# Patient Record
Sex: Male | Born: 1990 | Race: Black or African American | Hispanic: No | Marital: Single | Smoking: Never smoker
Health system: Southern US, Community
[De-identification: ages and names within clinical notes are randomized; demographics above are authoritative.]

## PROBLEM LIST (undated history)

## (undated) DIAGNOSIS — R011 Cardiac murmur, unspecified: Secondary | ICD-10-CM

---

## 2014-10-16 ENCOUNTER — Emergency Department (HOSPITAL_COMMUNITY): Payer: Managed Care, Other (non HMO)

## 2014-10-16 ENCOUNTER — Encounter (HOSPITAL_COMMUNITY): Payer: Self-pay | Admitting: Family Medicine

## 2014-10-16 ENCOUNTER — Emergency Department (HOSPITAL_COMMUNITY)
Admission: EM | Admit: 2014-10-16 | Discharge: 2014-10-16 | Disposition: A | Discharge status: Home / Self Care | Payer: Managed Care, Other (non HMO) | Attending: Emergency Medicine | Admitting: Emergency Medicine

## 2014-10-16 DIAGNOSIS — R011 Cardiac murmur, unspecified: Secondary | ICD-10-CM | POA: Insufficient documentation

## 2014-10-16 DIAGNOSIS — Z7982 Long term (current) use of aspirin: Secondary | ICD-10-CM | POA: Insufficient documentation

## 2014-10-16 DIAGNOSIS — R079 Chest pain, unspecified: Secondary | ICD-10-CM | POA: Insufficient documentation

## 2014-10-16 HISTORY — DX: Cardiac murmur, unspecified: R01.1

## 2014-10-16 LAB — CBC
HCT: 39.4 % (ref 39.0–52.0)
Hemoglobin: 13.6 g/dL (ref 13.0–17.0)
MCH: 30.7 pg (ref 26.0–34.0)
MCHC: 34.5 g/dL (ref 30.0–36.0)
MCV: 88.9 fL (ref 78.0–100.0)
Platelets: 197 10*3/uL (ref 150–400)
RBC: 4.43 MIL/uL (ref 4.22–5.81)
RDW: 12.7 % (ref 11.5–15.5)
WBC: 3.6 10*3/uL — ABNORMAL LOW (ref 4.0–10.5)

## 2014-10-16 LAB — BASIC METABOLIC PANEL
Anion gap: 9 (ref 5–15)
BUN: 13 mg/dL (ref 6–23)
CHLORIDE: 104 meq/L (ref 96–112)
CO2: 25 mmol/L (ref 19–32)
Calcium: 9.3 mg/dL (ref 8.4–10.5)
Creatinine, Ser: 1.12 mg/dL (ref 0.50–1.35)
GFR calc Af Amer: >90 mL/min (ref >90)
GFR calc non Af Amer: >90 mL/min (ref >90)
Glucose, Bld: 88 mg/dL (ref 70–99)
Potassium: 3.7 mmol/L (ref 3.5–5.1)
Sodium: 138 mmol/L (ref 135–145)

## 2014-10-16 LAB — I-STAT TROPONIN, ED: Troponin i, poc: 0 ng/mL (ref 0.00–0.08)

## 2014-10-16 MED ORDER — OMEPRAZOLE 20 MG PO CPDR: 20.0000 mg | DELAYED_RELEASE_CAPSULE | Freq: Every day | ORAL | Status: AC

## 2014-10-16 NOTE — ED Notes (Signed)
MD at bedside. 

## 2014-10-16 NOTE — ED Notes (Signed)
[facility]. sts left sided x 3 days. Denies SOB. Denies coughing fever.

## 2014-10-16 NOTE — ED Provider Notes (Signed)
CSN: [id number]     Arrival date & time 10/16/14  1359 History   First MD Initiated Contact with Patient 10/16/14 1505     Chief Complaint  Patient presents with  . Chest Pain      HPI Patient reports intermittent sharp left-sided chest pain without associated symptoms over the past week.  Denies shortness of breath.  He reports his been playing basketball this week without any difficulty.  He never develops exertional chest pain.  No lightheadedness, palpitations, syncope.  Denies fevers and chills.  No productive cough.  Symptoms are not always associated with food.  He is visiting his family here in [place].  He lives in [place].  He works at a high school.   no recent sick contacts.  No recent infectious symptoms such as fever, nasal congestion, sore throat.   Past Medical History  Diagnosis Date  . Heart murmur    History reviewed. No pertinent past surgical history. History reviewed. No pertinent family history. History  Substance Use Topics  . Smoking status: Never Smoker   . Smokeless tobacco: Not on file  . Alcohol Use: No    Review of Systems  All other systems reviewed and are negative.     Allergies  Review of patient's allergies indicates no known allergies.  Home Medications   Prior to Admission medications   Medication Sig Start Date End Date Taking? Authorizing Provider  aspirin 325 MG tablet Take 325 mg by mouth daily.   Yes Historical Provider, MD  omeprazole (PRILOSEC) 20 MG capsule Take 1 capsule (20 mg total) by mouth daily. 10/16/14   Lyanne CoKevin M Deirdra Heumann, MD   BP 134/84 mmHg  Pulse 78  Temp(Src) 98.1 F (36.7 C)  Resp 21  SpO2 100% Physical Exam  Constitutional: He is oriented to person, place, and time. He appears well-developed and well-nourished.  HENT:  Head: Normocephalic and atraumatic.  Eyes: EOM are normal.  Neck: Normal range of motion.  Cardiovascular: Normal rate, regular rhythm, normal heart sounds and intact  distal pulses.   Pulmonary/Chest: Effort normal and breath sounds normal. No respiratory distress. He exhibits no tenderness.  Abdominal: Soft. He exhibits no distension. There is no tenderness.  Musculoskeletal: Normal range of motion.  Neurological: He is alert and oriented to person, place, and time.  Skin: Skin is warm and dry.  Psychiatric: He has a normal mood and affect. Judgment normal.  Nursing note and vitals reviewed.   ED Course  Procedures (including critical care time) Labs Review Labs Reviewed  CBC - Abnormal; Notable for the following:    WBC 3.6 (*)    All other components within normal limits  BASIC METABOLIC PANEL  I-STAT TROPOININ, ED    Imaging Review Dg Chest 2 View  10/16/2014   CLINICAL DATA:  Chest pain for 3 days.  EXAM: CHEST  2 VIEW  COMPARISON:  None.  FINDINGS: The heart size and mediastinal contours are within normal limits. Both lungs are clear. No pneumothorax or pleural effusion is noted. The visualized skeletal structures are unremarkable.  IMPRESSION: No acute cardiopulmonary abnormality seen.   Electronically Signed   By: Roque LiasJames  Green M.D.   On: 10/16/2014 14:57     EKG Interpretation   Date/Time:  Thursday October 16 2014 14:07:30 EST Ventricular Rate:  66 PR Interval:  158 QRS Duration: 90 QT Interval:  364 QTC Calculation: 381 R Axis:   102 Text Interpretation:  Normal sinus rhythm with sinus arrhythmia Rightward  axis Nonspecific ST abnormality Abnormal ECG No old tracing to compare  Confirmed by Justine Cossin  MD, Caryn BeeKEVIN ([id number]) on 10/16/2014 3:22:51 PM      MDM   Final diagnoses:  Chest pain, unspecified chest pain type    Overall the patient is well-appearing.  He has intermittent sharps chest pain.  He has no associated symptoms.  He has no cardiac risk factors.  No syncope.  He does have a slightly abnormal EKG however I think his findings are more consistent with early repolarization abnormalities is a healthy thin chest wall  demand.  He plays basketball without any issues at all.  This does not appear to be Brugada either.  I've asked that the patient return to the ER for any new or worsening symptoms.  He has absolutely no symptoms at this time.  No additional workup necessary in the hospital.  He will find a primary care physician when [place] to [place].    Lyanne CoKevin M Katina Remick, MD 10/16/14 [phone]

## 2014-10-16 NOTE — Discharge Instructions (Signed)

## 2016-06-08 IMAGING — DX DG CHEST 2V
2 series · 2 of 2 positions shown · non-contrast
Comparison: None.

CLINICAL DATA: Chest pain for 3 days.

EXAM:
CHEST  2 VIEW

[chest pa]
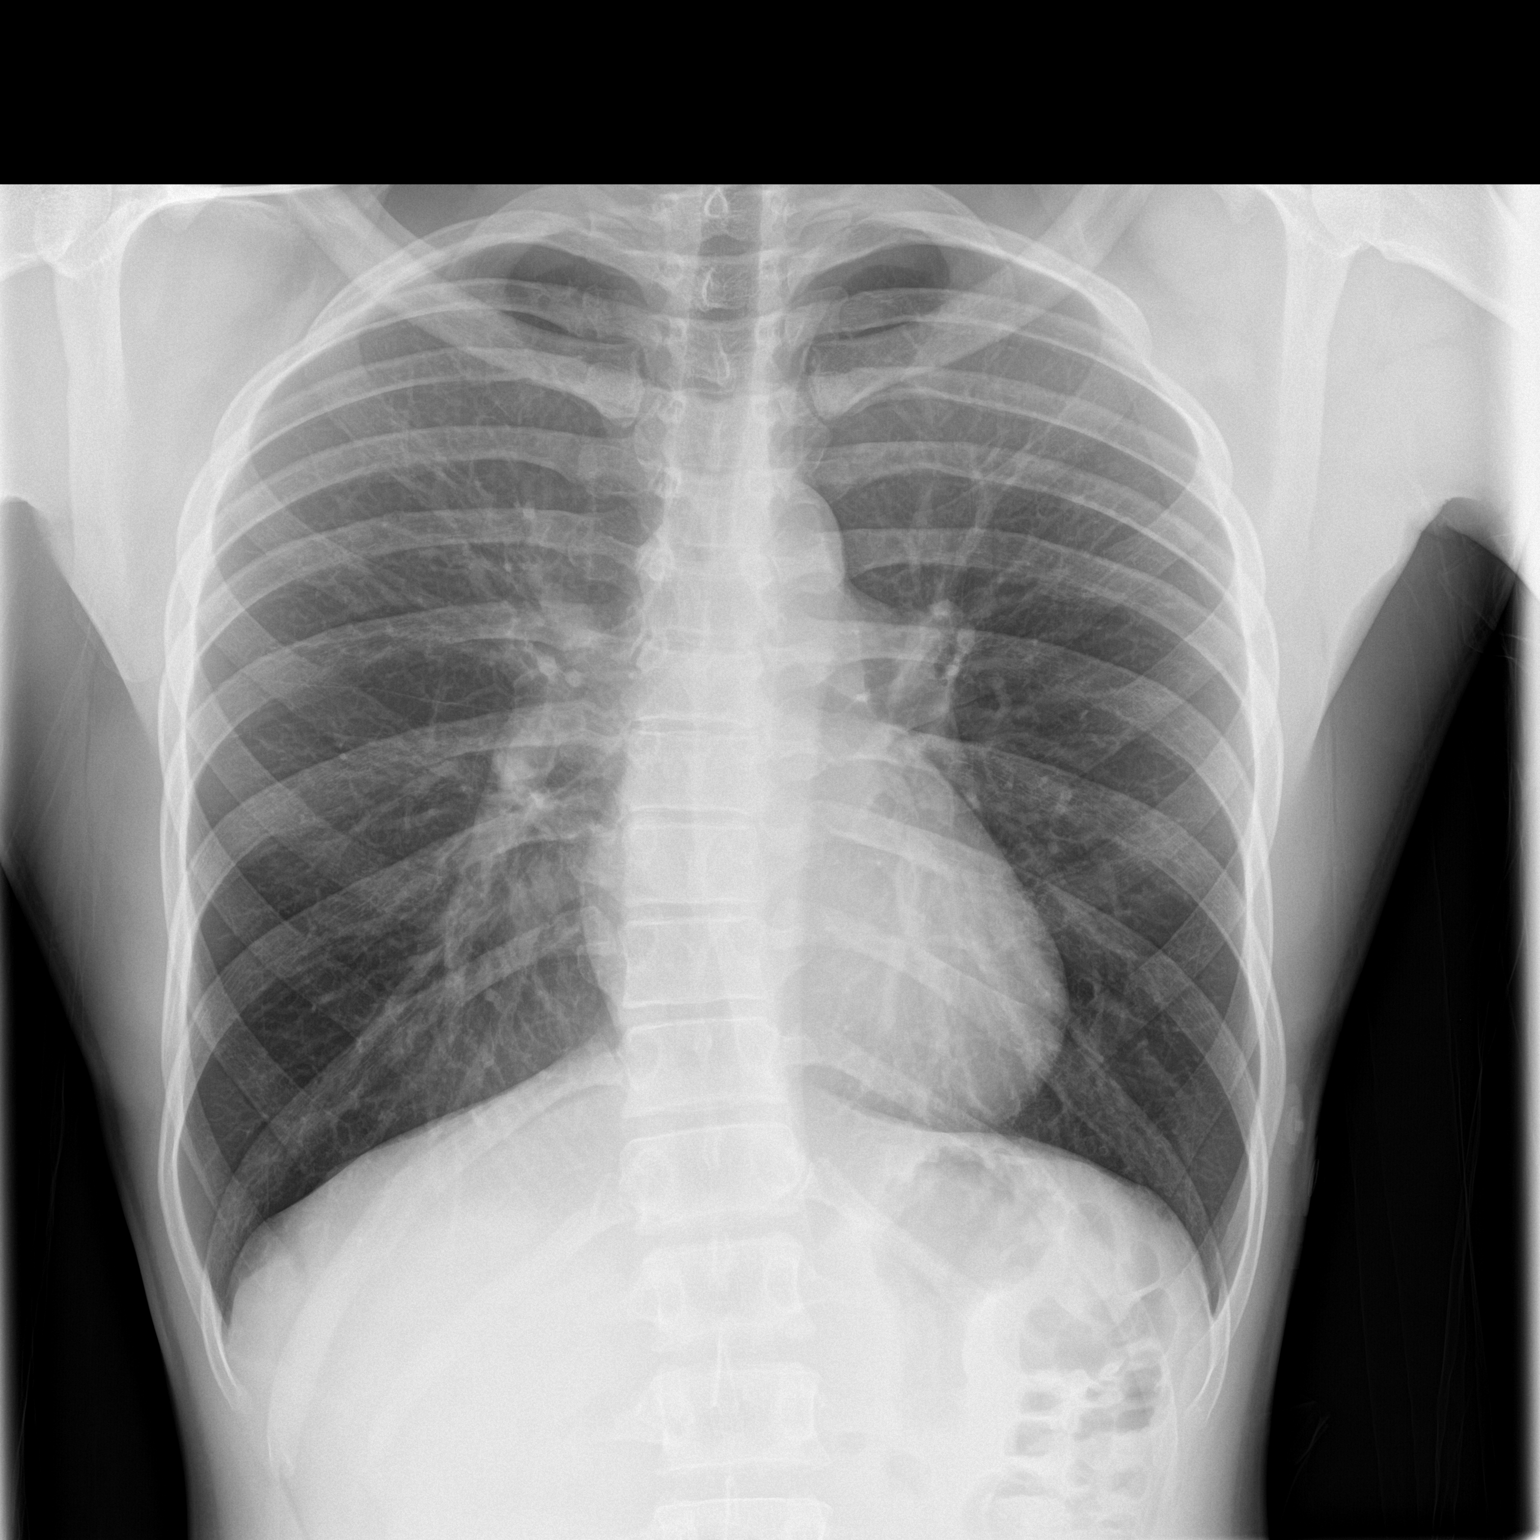

[chest lat]
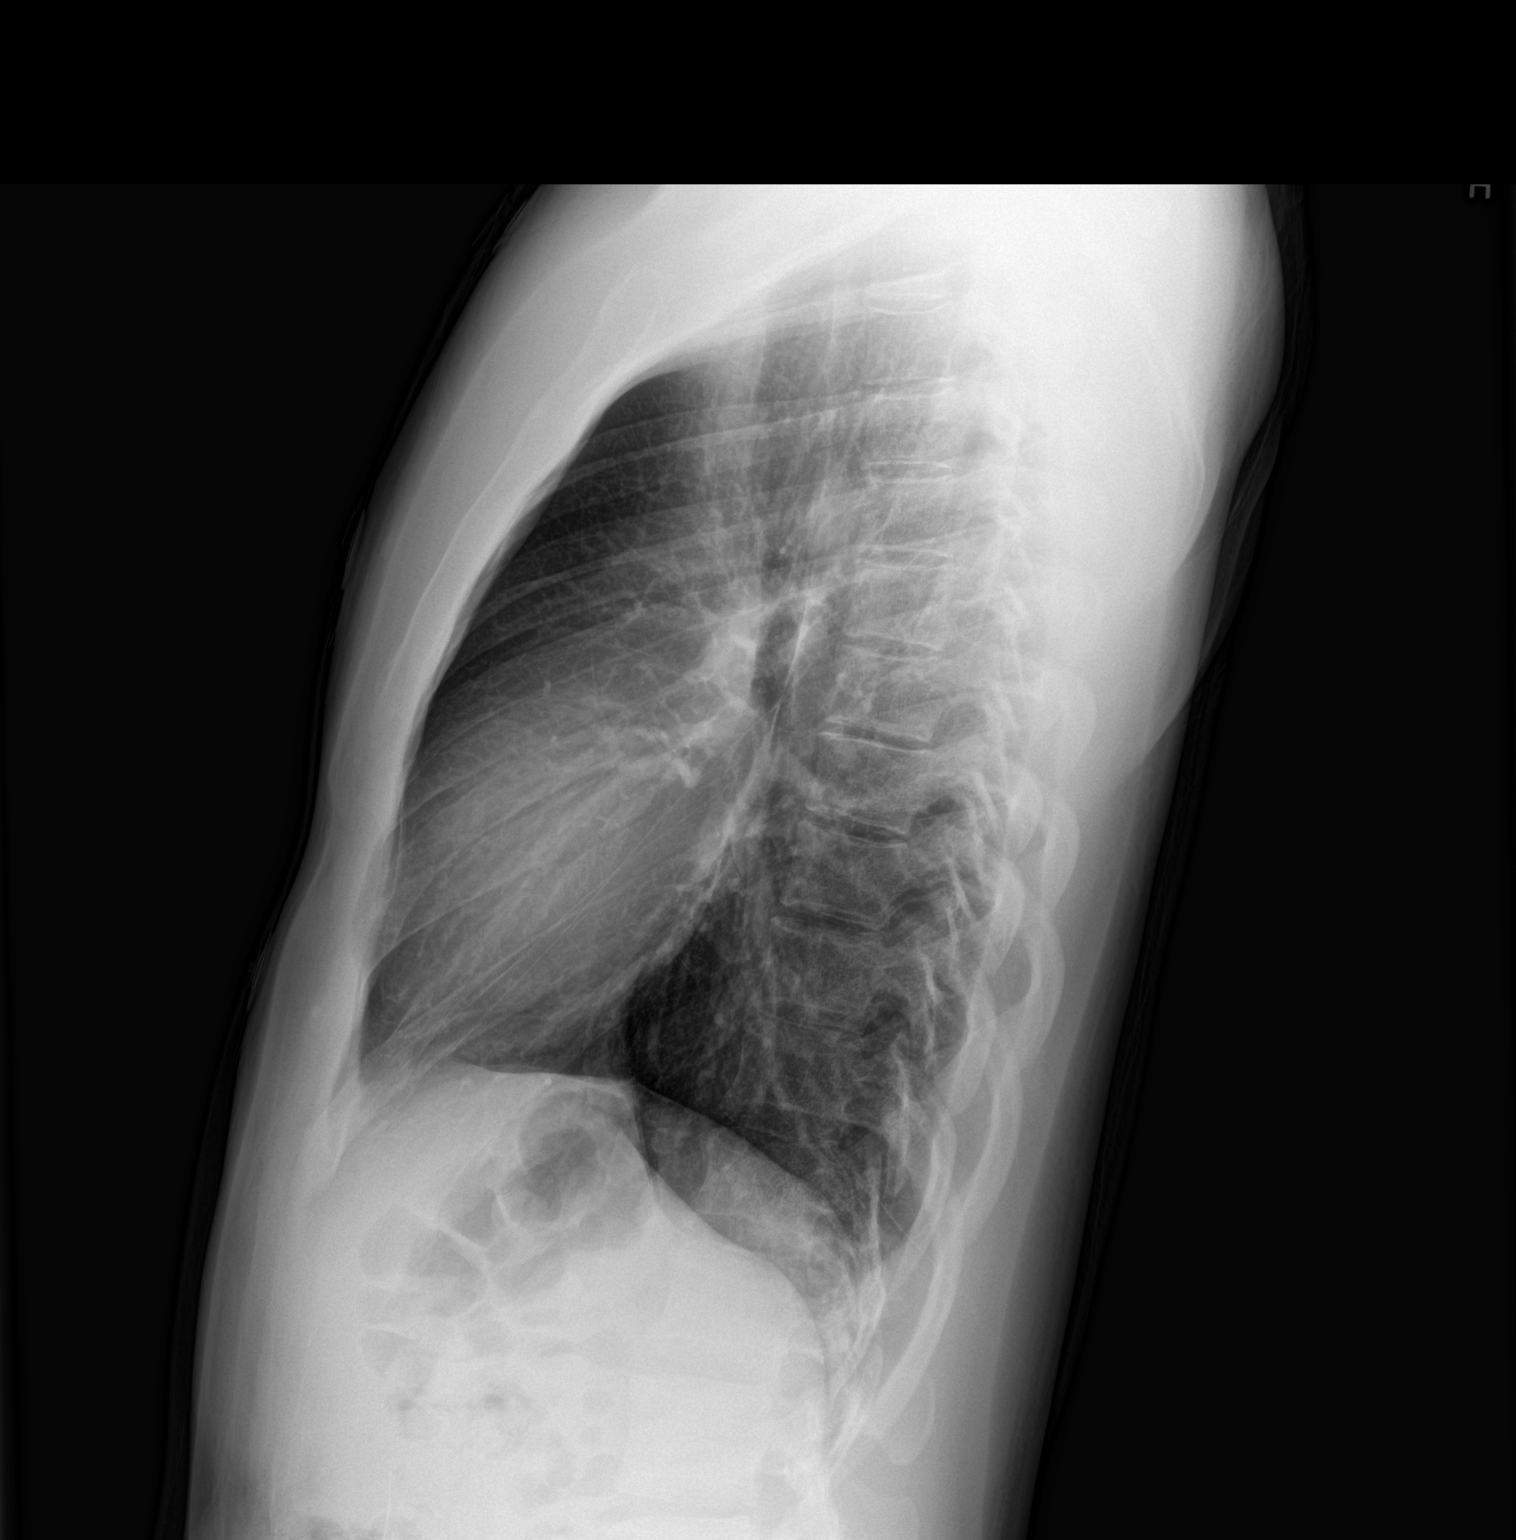

[2 of 2 positions shown; findings below may reference images not displayed]

FINDINGS: The heart size and mediastinal contours are within normal limits.
Both lungs are clear. No pneumothorax or pleural effusion is noted.
The visualized skeletal structures are unremarkable.
IMPRESSION: No acute cardiopulmonary abnormality seen.
